# Patient Record
Sex: Male | Born: 1995 | Race: Black or African American | Hispanic: No | Marital: Single | Smoking: Current every day smoker
Health system: Southern US, Community
[De-identification: ages and names within clinical notes are randomized; demographics above are authoritative.]

---

## 2016-12-26 ENCOUNTER — Encounter (HOSPITAL_BASED_OUTPATIENT_CLINIC_OR_DEPARTMENT_OTHER): Payer: Self-pay | Admitting: *Deleted

## 2016-12-26 ENCOUNTER — Emergency Department (HOSPITAL_BASED_OUTPATIENT_CLINIC_OR_DEPARTMENT_OTHER)
Admission: EM | Admit: 2016-12-26 | Discharge: 2016-12-26 | Disposition: A | Discharge status: Home / Self Care | Payer: No Typology Code available for payment source | Attending: Emergency Medicine | Admitting: Emergency Medicine

## 2016-12-26 ENCOUNTER — Emergency Department (HOSPITAL_BASED_OUTPATIENT_CLINIC_OR_DEPARTMENT_OTHER): Payer: No Typology Code available for payment source

## 2016-12-26 ENCOUNTER — Other Ambulatory Visit: Payer: Self-pay

## 2016-12-26 DIAGNOSIS — Y998 Other external cause status: Secondary | ICD-10-CM | POA: Diagnosis not present

## 2016-12-26 DIAGNOSIS — M546 Pain in thoracic spine: Secondary | ICD-10-CM | POA: Diagnosis not present

## 2016-12-26 DIAGNOSIS — Y9389 Activity, other specified: Secondary | ICD-10-CM | POA: Diagnosis not present

## 2016-12-26 DIAGNOSIS — F17200 Nicotine dependence, unspecified, uncomplicated: Secondary | ICD-10-CM | POA: Insufficient documentation

## 2016-12-26 DIAGNOSIS — Y92410 Unspecified street and highway as the place of occurrence of the external cause: Secondary | ICD-10-CM | POA: Insufficient documentation

## 2016-12-26 DIAGNOSIS — S199XXA Unspecified injury of neck, initial encounter: Secondary | ICD-10-CM | POA: Diagnosis present

## 2016-12-26 DIAGNOSIS — S161XXA Strain of muscle, fascia and tendon at neck level, initial encounter: Secondary | ICD-10-CM | POA: Insufficient documentation

## 2016-12-26 MED ORDER — NAPROXEN 375 MG PO TABS: 375.0000 mg | ORAL_TABLET | Freq: Two times a day (BID) | ORAL | 0 refills | Status: AC

## 2016-12-26 MED ORDER — IBUPROFEN 200 MG PO TABS
600.0000 mg | ORAL_TABLET | Freq: Once | ORAL | Status: AC
  Administered 2016-12-26: 600 mg via ORAL
  Filled 2016-12-26: qty 1

## 2016-12-26 MED ORDER — CYCLOBENZAPRINE HCL 10 MG PO TABS: 10.0000 mg | ORAL_TABLET | Freq: Two times a day (BID) | ORAL | 0 refills | Status: AC | PRN
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
## 2016-12-26 NOTE — Discharge Instructions (Signed)
Trays were reassuring.  No signs of fracture.  This is likely musculoskeletal pain. Please take the Naproxen as prescribed for pain. Do not take any additional NSAIDs including Motrin, Aleve, Ibuprofen, Advil.  Have been given your first dose in the ED.  Do not take any additional this evening. Please the the Flexeril for muscle relaxation. This medication will make you drowsy so avoid situation that could place you in danger.  Workup has been normal. Please take medications as prescribed and instructed.  SEEK IMMEDIATE MEDICAL ATTENTION IF: New numbness, tingling, weakness, or problem with the use of your arms or legs.  Severe back pain not relieved with medications.  Change in bowel or bladder control.  Urinary retention.  Numbness in your groin.  Increasing pain in any areas of the body (such as chest or abdominal pain).  Shortness of breath, dizziness or fainting.  Nausea (feeling sick to your stomach), vomiting, fever, or sweats.
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
